# Patient Record
Sex: Male | Born: 1995 | Race: White | Hispanic: No | Marital: Single | Smoking: Current some day smoker
Health system: Southern US, Community
[De-identification: ages and names within clinical notes are randomized; demographics above are authoritative.]

## PROBLEM LIST (undated history)

## (undated) DIAGNOSIS — Z789 Other specified health status: Secondary | ICD-10-CM

## (undated) HISTORY — DX: Other specified health status: Z78.9

---
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
## 2020-10-01 DIAGNOSIS — R519 Headache, unspecified: Secondary | ICD-10-CM | POA: Diagnosis not present

## 2020-10-01 DIAGNOSIS — R509 Fever, unspecified: Secondary | ICD-10-CM | POA: Diagnosis not present

## 2020-10-01 DIAGNOSIS — R52 Pain, unspecified: Secondary | ICD-10-CM | POA: Diagnosis not present

## 2020-10-01 DIAGNOSIS — U071 COVID-19: Secondary | ICD-10-CM | POA: Diagnosis not present

## 2020-10-01 DIAGNOSIS — J029 Acute pharyngitis, unspecified: Secondary | ICD-10-CM | POA: Diagnosis not present

## 2020-10-01 DIAGNOSIS — J020 Streptococcal pharyngitis: Secondary | ICD-10-CM | POA: Diagnosis not present

## 2021-04-06 DIAGNOSIS — J039 Acute tonsillitis, unspecified: Secondary | ICD-10-CM | POA: Diagnosis not present

## 2021-04-06 DIAGNOSIS — R059 Cough, unspecified: Secondary | ICD-10-CM | POA: Diagnosis not present

## 2021-04-06 DIAGNOSIS — R0981 Nasal congestion: Secondary | ICD-10-CM | POA: Diagnosis not present

## 2021-06-26 DIAGNOSIS — L237 Allergic contact dermatitis due to plants, except food: Secondary | ICD-10-CM | POA: Diagnosis not present

## 2021-06-26 DIAGNOSIS — Z6836 Body mass index (BMI) 36.0-36.9, adult: Secondary | ICD-10-CM | POA: Diagnosis not present

## 2021-07-06 ENCOUNTER — Encounter: Payer: Self-pay | Admitting: Nurse Practitioner

## 2021-07-06 ENCOUNTER — Ambulatory Visit: Payer: BC Managed Care – PPO | Admitting: Nurse Practitioner

## 2021-07-06 VITALS — BP 131/84 | HR 94 | Temp 98.7°F | Ht 72.0 in | Wt 280.0 lb

## 2021-07-06 DIAGNOSIS — Z7689 Persons encountering health services in other specified circumstances: Secondary | ICD-10-CM | POA: Diagnosis not present

## 2021-07-06 DIAGNOSIS — L237 Allergic contact dermatitis due to plants, except food: Secondary | ICD-10-CM

## 2021-07-06 MED ORDER — METHYLPREDNISOLONE ACETATE 80 MG/ML IJ SUSP
80.0000 mg | Freq: Once | INTRAMUSCULAR | Status: AC
  Administered 2021-07-06: 80 mg via INTRAMUSCULAR

## 2021-07-06 MED ORDER — HYDROCORTISONE 1 % EX LOTN: 1.0000 "application " | TOPICAL_LOTION | Freq: Two times a day (BID) | CUTANEOUS | 0 refills | Status: DC

## 2021-07-06 MED ORDER — PREDNISONE 10 MG (21) PO TBPK: ORAL_TABLET | ORAL | 0 refills | Status: DC

## 2021-07-06 NOTE — Progress Notes (Signed)
? ?New Patient Note ? ?RE: Jason Durham MRN: [id number] DOB: Aug 11, 1995 ?Date of Office Visit: 07/06/2021 ? ?Chief Complaint: New Patient (Initial Visit) (Establish care//Poison oak) ? ?History of Present Illness: ? ?Patient is establishing care today and has complains of rash on right hand , trunk, and back.  ? ? ?Rash: Patient complains of rash involving the right arm, right hand, trunk, and Back . Rash started a few day ago. Appearance of rash at onset: Other appearance:red. Rash has not changed over time Initial distribution: back.  Discomfort associated with rash: is pruritic. Denies: cough, fever, nausea, sore throat, and vomiting. Patient has had previous evaluation of rash. Patient has had previous treatment.  Response to treatment: not therapeutic. Patient has not had contacts with similar rash. Patient has not identified precipitant. Patient has had new exposures (soaps, lotions, laundry detergents, foods, medications, plants, insects or animals. ? ?Assessment and Plan: ?Jason Durham is a 26 y.o. 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 ?Keep skin moisturized ?-Avoid hot showers ?-1% hydrocortisone topical cream ?-Prednisone taper ?-80 Depo-Medrol shot in clinic ?-Patient knows to follow-up with worsening unresolved symptoms. ? ? ? ? ?No problem-specific Assessment & Plan notes found for this encounter. ? ?Return if symptoms worsen or fail to improve. ? ? ?Diagnostics: ? ? ?Past Medical History: ?There are no problems to display for this patient. ? ?Past Medical History:  ?Diagnosis Date  ? No pertinent past medical history   ? ?Past Surgical History: ?History reviewed. No pertinent surgical history. ?Medication List:  ?Current Outpatient Medications  ?Medication Sig Dispense Refill  ? hydrocortisone 1 % lotion Apply 1 application. topically 2 (two) times daily. 118 mL 0  ? predniSONE (STERAPRED UNI-PAK 21 TAB) 10 MG (21) TBPK tablet 6 tablet day 1, 5 tablet day 2, 4 tablet day 3, 3 tablet day 4, 2 tablet day 5, 1  tablet day 6 1 each 0  ? ?No current facility-administered medications for this visit.  ? ?Allergies: ?No Known Allergies ?Social History: ?Social History  ? ?Socioeconomic History  ? Marital status: Single  ?  Spouse name: Not on file  ? Number of children: Not on file  ? Years of education: Not on file  ? Highest education level: Not on file  ?Occupational History  ? Not on file  ?Tobacco Use  ? Smoking status: Some Days  ?  Types: Cigarettes  ? Smokeless tobacco: Never  ?Substance and Sexual Activity  ? Alcohol use: Never  ? Drug use: Never  ? Sexual activity: Not Currently  ?Other Topics Concern  ? Not on file  ?Social History Narrative  ? Not on file  ? ?Social Determinants of Health  ? ?Financial Resource Strain: Not on file  ?Food Insecurity: Not on file  ?Transportation Needs: Not on file  ?Physical Activity: Not on file  ?Stress: Not on file  ?Social Connections: Not on file  ? ? ? ? ? ?Family History: ?Family History  ?Problem Relation Age of Onset  ? Asthma Mother   ? ?     ? ?Review of Systems  ?Constitutional: Negative.   ?HENT: Negative.    ?Respiratory: Negative.    ?Cardiovascular: Negative.   ?Gastrointestinal: Negative.   ?Genitourinary: Negative.   ?Musculoskeletal: Negative.   ?Skin:  Positive for rash.  ?Psychiatric/Behavioral:  The patient is not nervous/anxious.   ?All other systems reviewed and are negative. ?Objective: ?BP 131/84   Pulse 94   Temp 98.7 ?F (37.1 ?C)   Ht 6' (1.829 m)  Wt 280 lb (127 kg)   SpO2 98%   BMI 37.97 kg/m?  ?Body mass index is 37.97 kg/m?Marland Kitchen ?Physical Exam ?Vitals and nursing note reviewed.  ?Constitutional:   ?   Appearance: Normal appearance.  ?HENT:  ?   Head: Normocephalic.  ?   Right Ear: External ear normal.  ?   Left Ear: External ear normal.  ?   Nose: Nose normal.  ?   Mouth/Throat:  ?   Mouth: Mucous membranes are moist.  ?   Pharynx: Oropharynx is clear.  ?Eyes:  ?   Conjunctiva/sclera: Conjunctivae normal.  ?Cardiovascular:  ?   Rate and Rhythm:  Normal rate and regular rhythm.  ?   Pulses: Normal pulses.  ?   Heart sounds: Normal heart sounds.  ?Pulmonary:  ?   Effort: Pulmonary effort is normal.  ?   Breath sounds: Normal breath sounds.  ?Abdominal:  ?   General: Bowel sounds are normal.  ?Skin: ?   General: Skin is warm and dry.  ?   Findings: Erythema and rash present.  ?Neurological:  ?   General: No focal deficit present.  ?   Mental Status: He is alert and oriented to person, place, and time.  ?Psychiatric:     ?   Behavior: Behavior normal.  ? ?The plan was reviewed with the patient/family, and all questions/concerned were addressed. ? ?It was my pleasure to see [name] today and participate in his care. Please feel free to contact me with any questions or concerns. ? ?Sincerely, ? ?Lynnell Chad NP ?[facility] ?

## 2021-07-06 NOTE — Patient Instructions (Signed)
Poison Oak Dermatitis °Poison oak dermatitis is inflammation of the skin that is caused by contact with the chemicals in the leaves of the poison oak (Toxicodendron) plant. The skin reaction often includes redness, swelling, blisters, and extreme itching. °What are the causes? °This condition is caused by a specific chemical (urushiol) that is found in the sap of the poison oak plant. This chemical is sticky and can be easily spread to people, animals, and objects. You can get poison oak dermatitis by: °Having direct contact with a poison oak plant. °Touching animals, other people, or objects that have come in contact with poison oak and have the chemical on them. °What increases the risk? °This condition is more likely to develop in people who: °Are outdoors often in wooded or marshy areas. °Go outdoors without wearing protective clothing, such as closed shoes, long pants, and a long-sleeved shirt. °What are the signs or symptoms? °Symptoms of this condition include: °Redness of the skin. °Extreme itching. °A rash that often includes bumps and blisters. The rash usually appears 48 hours after exposure if you have been exposed before. If this is the first time you have been exposed, the rash may not appear until a week after exposure. °Swelling. This may occur if the reaction is more severe. °Symptoms usually last for 1-2 weeks. However, the first time you develop this condition, symptoms may last 3-4 weeks. °How is this diagnosed? °This condition may be diagnosed based on your symptoms and a physical exam. Your health care provider may also ask you about any recent outdoor activity. °How is this treated? °Treatment for this condition will vary depending on how severe it is. Treatment may include: °Hydrocortisone creams or calamine lotions to relieve itching. °Oatmeal baths to soothe the skin. °Over-the-counter antihistamine medicines to help reduce itching. °Steroid medicine taken by mouth (orally) for more severe  reactions. °Follow these instructions at home: °Medicines °Take or apply over-the-counter and prescription medicines only as told by your health care provider. °Use hydrocortisone creams or calamine lotion as needed to soothe the skin and relieve itching. °General instructions °Do not scratch or rub your skin. °Apply a cold, wet cloth (cold compress) to the affected areas or take baths in cool water. This will help with itching. Avoid hot baths and showers. °Take oatmeal baths as needed. Use colloidal oatmeal. You can get this at your local pharmacy or grocery store. Follow the instructions on the packaging. °While you have the rash, wash clothes right after you wear them. °Keep all follow-up visits as told by your health care provider. This is important. °How is this prevented? ° °Learn to identify the poison oak plant and avoid contact with the plant. This plant can be recognized by the number of leaves. Generally, poison oak has three leaves with flowering branches on a single stem. The leaves are often a bit fuzzy and have a toothlike edge. °If you have been exposed to poison oak, thoroughly wash your skin with soap and water right away. You have about 30 minutes to remove the plant resin before it will cause the rash. Be sure to wash under your fingernails because any plant resin there will continue to spread the rash. °When hiking or camping, wear clothes that will help you avoid exposure on the skin. This includes long pants, a long-sleeved shirt, tall socks, and hiking boots. You can also apply preventive lotion to your skin to help limit exposure. °If you suspect that your clothes or outdoor gear came in contact with   poison oak, rinse them off outside with a garden hose before bringing them inside your house. °When doing yard work or gardening, wear gloves, long sleeves, long pants, and boots. Wash your garden tools and gloves if they come in contact with poison oak. °If you suspect that your pet has come  into contact with poison oak, wash him or her with pet shampoo and water. Make sure you wear gloves while washing your pet. °Do not burn poison oak plants. This can release the chemical from the plant into the air and may cause a reaction on the skin or eyes, or in the lungs from breathing in the smoke. °Contact a health care provider if you have: °Open sores in the rash area. °More redness, swelling, or pain in the affected area. °Redness that spreads beyond the rash area. °Fluid, blood, or pus coming from the affected area. °A fever. °A rash over a large area of your body. °A rash on your eyes, mouth, or genitals. °A rash that does not improve after a few weeks. °Get help right away if: °Your face swells or your eyes swell shut. °You have trouble breathing. °You have trouble swallowing. °These symptoms may represent a serious problem that is an emergency. Do not wait to see if the symptoms will go away. Get medical help right away. Call your local emergency services (911 in the [place] Do not drive yourself to the hospital. °Summary °Poison oak dermatitis is inflammation of the skin that is caused by contact with the chemicals on the leaves of the poison oak (Toxicodendron) plant. °Symptoms of this condition include redness, extreme itching, a rash, and swelling. °Do not scratch or rub your skin. °Take or apply over-the-counter and prescription medicines only as told by your health care provider. °This information is not intended to replace advice given to you by your health care provider. Make sure you discuss any questions you have with your health care provider. °Document Revised: 07/27/2018 Document Reviewed: 05/04/2018 °Elsevier Patient Education © 2022 [employer] ° °

## 2021-08-13 ENCOUNTER — Encounter: Payer: BC Managed Care – PPO | Admitting: Nurse Practitioner

## 2021-09-02 ENCOUNTER — Ambulatory Visit: Payer: BC Managed Care – PPO | Admitting: Family Medicine

## 2021-09-02 ENCOUNTER — Ambulatory Visit (INDEPENDENT_AMBULATORY_CARE_PROVIDER_SITE_OTHER): Payer: BC Managed Care – PPO

## 2021-09-02 ENCOUNTER — Encounter: Payer: Self-pay | Admitting: Family Medicine

## 2021-09-02 VITALS — BP 143/92 | HR 86 | Temp 98.4°F | Ht 72.0 in | Wt 278.8 lb

## 2021-09-02 DIAGNOSIS — S6992XA Unspecified injury of left wrist, hand and finger(s), initial encounter: Secondary | ICD-10-CM | POA: Diagnosis not present

## 2021-09-02 DIAGNOSIS — M7989 Other specified soft tissue disorders: Secondary | ICD-10-CM | POA: Diagnosis not present

## 2021-09-02 DIAGNOSIS — S60922A Unspecified superficial injury of left hand, initial encounter: Secondary | ICD-10-CM | POA: Diagnosis not present

## 2021-09-02 DIAGNOSIS — L03114 Cellulitis of left upper limb: Secondary | ICD-10-CM

## 2021-09-02 DIAGNOSIS — Z23 Encounter for immunization: Secondary | ICD-10-CM

## 2021-09-02 MED ORDER — AMOXICILLIN-POT CLAVULANATE 875-125 MG PO TABS: 1.0000 | ORAL_TABLET | Freq: Two times a day (BID) | ORAL | 0 refills | Status: AC

## 2021-09-02 NOTE — Progress Notes (Signed)
Chief Complaint  Patient presents with   Hand Injury    Drilled into hand 08/31/21    HPI  Patient presents today for drill slipped when it penetrated the plastic piece he was drilling into 2 days ago.  The drill slipped and went into the webspace between the thumb and second finger.  The whole hand went numb immediately.  The sensation did come back pretty quickly.  He had a lot of bleeding.  He got the bleeding stopped but now he is having a lot of swelling 2 days later with pain and he cannot fully extend the left second finger at the MCP and IP joints.  He can move the thumb at the PIP but has very limited motion at the MCP.  PMH: Smoking status noted ROS: Per HPI  Objective: BP (!) 143/92   Pulse 86   Temp 98.4 F (36.9 C)   Ht 6' (1.829 m)   Wt 278 lb 12.8 oz (126.5 kg)   SpO2 100%   BMI 37.81 kg/m  Gen: NAD, alert, cooperative with exam HEENT: NCAT, EOMI, PERRL CV: RRR, good S1/S2, no murmur Resp: CTABL, no wheezes, non-labored Ext: Moderate erthema and edema over the thenar eminence to the first and second MCP areas.  There is full flexion of the second finger but only about 10 degrees of extension.  Opposition is normal.  Fingertips have normal sensation.  Good capillary refill throughout. Neuro: Alert and oriented, No gross deficits  Assessment and plan:  1. Need for Tdap vaccination   2. Cellulitis of left hand     Meds ordered this encounter  Medications   amoxicillin-clavulanate (AUGMENTIN) 875-125 MG tablet    Sig: Take 1 tablet by mouth 2 (two) times daily. Take all of this medication    Dispense:  20 tablet    Refill:  0    Orders Placed This Encounter  Procedures   DG Hand Complete Left    Standing Status:   Future    Number of Occurrences:   1    Standing Expiration Date:   09/03/2022    Order Specific Question:   Reason for Exam (SYMPTOM  OR DIAGNOSIS REQUIRED)    Answer:   trauma at base of 2nd finger with laceration.    Order Specific Question:    Preferred imaging location?    Answer:   Internal   Tdap vaccine greater than or equal to 7yo IM   Wound care reviewed. Dressing applied.  Follow up as needed.  Claretta Fraise, MD

## 2021-09-05 ENCOUNTER — Encounter: Payer: Self-pay | Admitting: Family Medicine

## 2022-08-08 DIAGNOSIS — M25562 Pain in left knee: Secondary | ICD-10-CM | POA: Diagnosis not present

## 2022-11-30 IMAGING — DX DG HAND COMPLETE 3+V*L*
3 series · 3 of 3 positions shown · non-contrast
Comparison: None Available.

CLINICAL DATA: Recent trauma with second finger laceration, initial
encounter

EXAM:
LEFT HAND - COMPLETE 3+ VIEW

[hand pa]
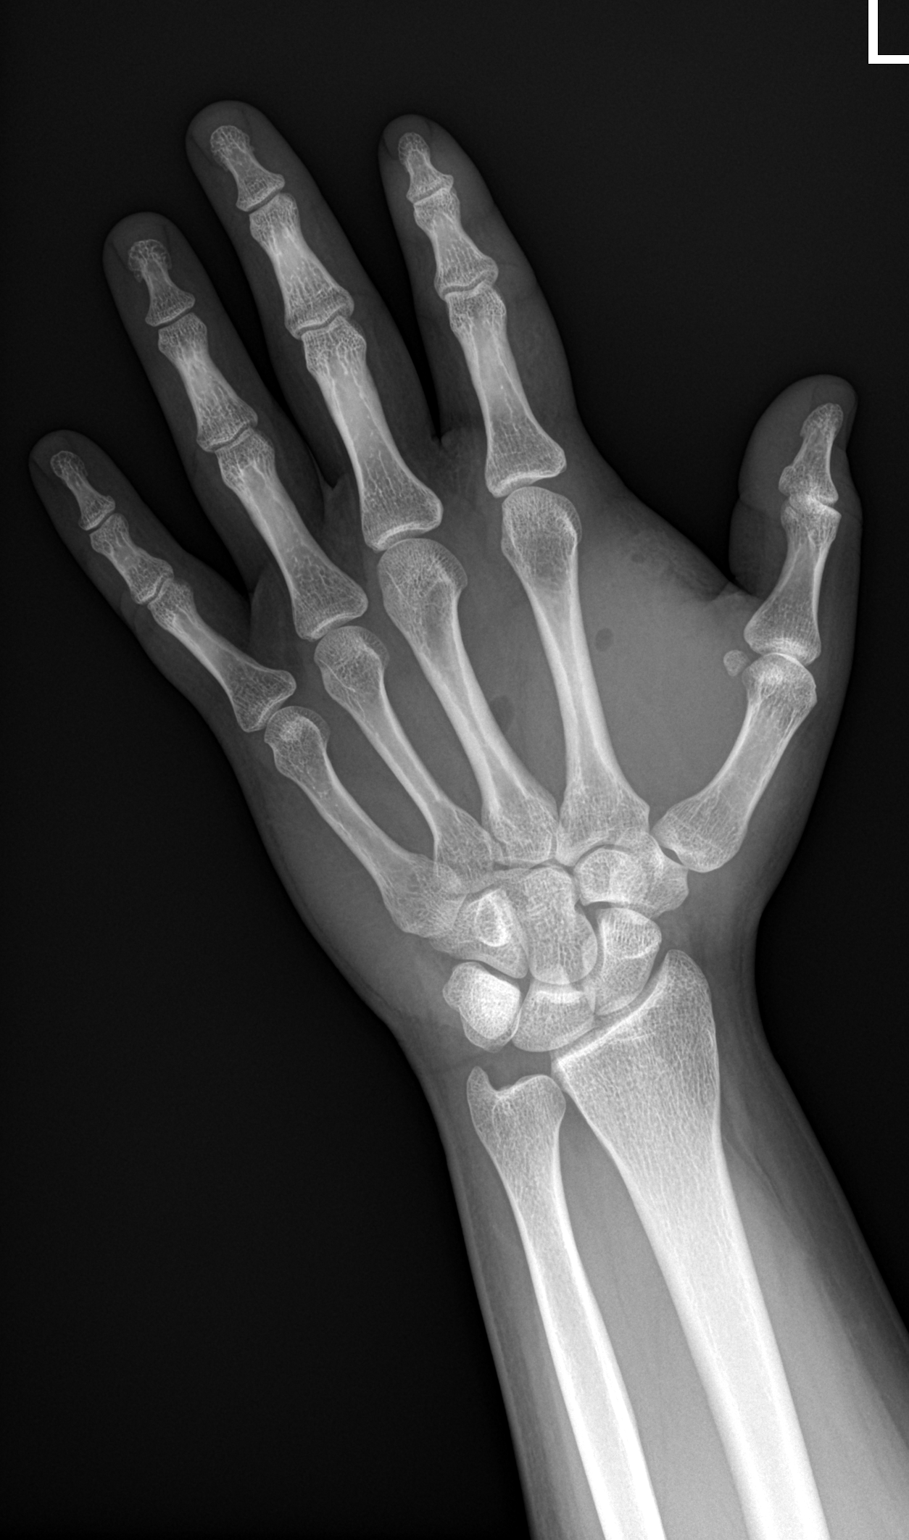

[hand obl]
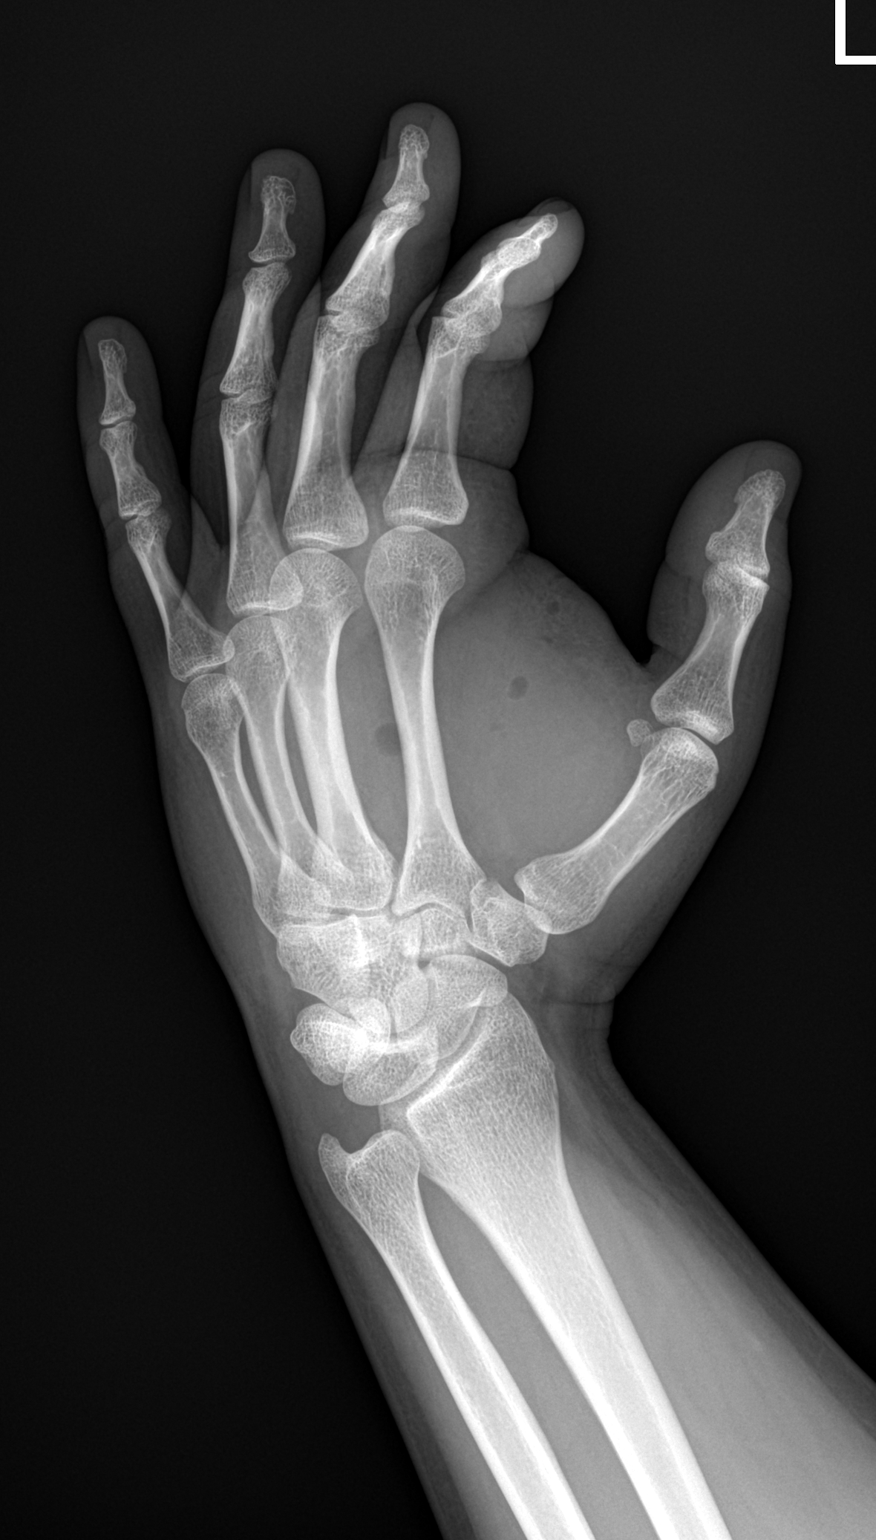

[hand lat]
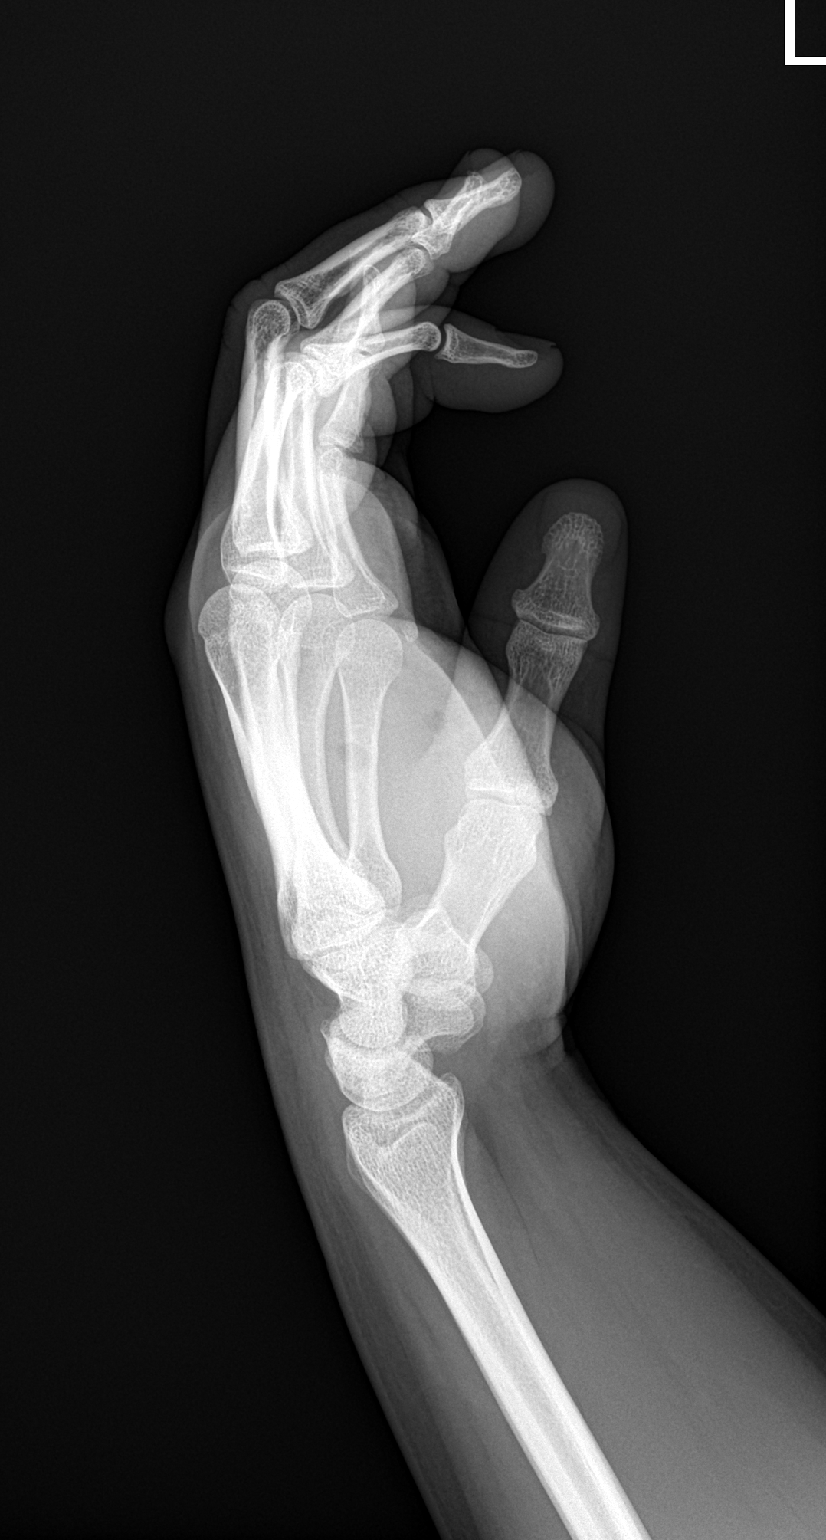

[3 of 3 positions shown; findings below may reference images not displayed]

FINDINGS: No radiopaque foreign body is noted. Soft tissue swelling is noted
involving the palm are area well the second digit. Small foci of air
are noted likely related to the recent laceration. No radiopaque
foreign body is seen.
IMPRESSION: Changes consistent with known laceration with air in the
subcutaneous tissues. Generalized soft tissue swelling is seen
without bony abnormality.
